# Patient Record
Sex: Female | Born: 1980 | Race: Black or African American | Hispanic: No | Marital: Single | Smoking: Current every day smoker
Health system: Southern US, Community
[De-identification: ages and names within clinical notes are randomized; demographics above are authoritative.]

## PROBLEM LIST (undated history)

## (undated) HISTORY — PX: TUBAL LIGATION: SHX77

---

## 2013-02-01 ENCOUNTER — Other Ambulatory Visit: Payer: Self-pay | Admitting: General Practice

## 2014-07-30 ENCOUNTER — Emergency Department (HOSPITAL_COMMUNITY)
Admission: EM | Admit: 2014-07-30 | Discharge: 2014-07-30 | Disposition: A | Discharge status: Home / Self Care | Payer: Medicaid Other | Attending: Emergency Medicine | Admitting: Emergency Medicine

## 2014-07-30 ENCOUNTER — Encounter (HOSPITAL_COMMUNITY): Payer: Self-pay | Admitting: *Deleted

## 2014-07-30 DIAGNOSIS — K088 Other specified disorders of teeth and supporting structures: Secondary | ICD-10-CM | POA: Diagnosis not present

## 2014-07-30 DIAGNOSIS — Z720 Tobacco use: Secondary | ICD-10-CM | POA: Insufficient documentation

## 2014-07-30 DIAGNOSIS — M542 Cervicalgia: Secondary | ICD-10-CM | POA: Diagnosis not present

## 2014-07-30 DIAGNOSIS — K029 Dental caries, unspecified: Secondary | ICD-10-CM | POA: Diagnosis not present

## 2014-07-30 DIAGNOSIS — K0889 Other specified disorders of teeth and supporting structures: Secondary | ICD-10-CM

## 2014-07-30 MED ORDER — IBUPROFEN 600 MG PO TABS: 600.0000 mg | ORAL_TABLET | Freq: Four times a day (QID) | ORAL | Status: AC | PRN

## 2014-07-30 MED ORDER — TRAMADOL HCL 50 MG PO TABS: ORAL_TABLET | ORAL | Status: AC

## 2014-07-30 MED ORDER — CLINDAMYCIN HCL 150 MG PO CAPS: ORAL_CAPSULE | ORAL | Status: AC

## 2014-07-30 NOTE — ED Notes (Signed)
Hobson Bryant at bedside 

## 2014-07-30 NOTE — ED Provider Notes (Signed)
CSN: [id number]     Arrival date & time 07/30/14  1637 History   None    Chief Complaint  Patient presents with  . Dental Pain     (Consider location/radiation/quality/duration/timing/severity/associated sxs/prior Treatment) Patient is a 34 y.o. female presenting with tooth pain. The history is provided by the patient.  Dental Pain Location:  Lower Quality:  Throbbing Severity:  Moderate Onset quality:  Gradual Duration:  2 months Timing:  Intermittent Progression:  Worsening Chronicity:  Chronic Context: dental caries   Relieved by:  Nothing Worsened by:  Cold food/drink Associated symptoms: gum swelling and neck pain   Associated symptoms: no drooling, no fever and no trismus   Risk factors: lack of dental care and smoking   Risk factors: no diabetes     History reviewed. No pertinent past medical history. Past Surgical History  Procedure Laterality Date  . Tubal ligation     No family history on file. History  Substance Use Topics  . Smoking status: Current Every Day Smoker -- 0.50 packs/day    Types: Cigarettes  . Smokeless tobacco: Not on file  . Alcohol Use: Yes     Comment: socially   OB History    No data available     Review of Systems  Constitutional: Negative for fever.  HENT: Positive for dental problem. Negative for drooling.   Musculoskeletal: Positive for neck pain.  All other systems reviewed and are negative.     Allergies  Review of patient's allergies indicates no known allergies.  Home Medications   Prior to Admission medications   Not on File   BP 138/97 mmHg  Pulse 80  Temp(Src) 99.1 F (37.3 C) (Oral)  Resp 16  Ht  (1.626 m)  Wt 171 lb (77.565 kg)  BMI 29.34 kg/m2  SpO2 100%  LMP 06/30/2014 Physical Exam  Constitutional: She is oriented to person, place, and time. She appears well-developed and well-nourished.  Non-toxic appearance.  HENT:  Head: Normocephalic.  Right Ear: Tympanic membrane and external ear  normal.  Left Ear: Tympanic membrane and external ear normal.  There is a deep cavity of the right lower third molar. There is swelling about the lower gum. No visible abscess appreciated. The airway is patent. There is no swelling under the tongue.  Eyes: EOM and lids are normal. Pupils are equal, round, and reactive to light.  Neck: Normal range of motion. Neck supple. Carotid bruit is not present.  Cardiovascular: Normal rate, regular rhythm, normal heart sounds, intact distal pulses and normal pulses.   No murmur heard. Pulmonary/Chest: Breath sounds normal. No respiratory distress.  Abdominal: Soft. Bowel sounds are normal. There is no tenderness. There is no guarding.  Musculoskeletal: Normal range of motion.  Lymphadenopathy:       Head (right side): No submandibular adenopathy present.       Head (left side): No submandibular adenopathy present.    She has no cervical adenopathy.  Neurological: She is alert and oriented to person, place, and time. She has normal strength. No cranial nerve deficit or sensory deficit.  Skin: Skin is warm and dry.  Psychiatric: She has a normal mood and affect. Her speech is normal.  Nursing note and vitals reviewed.   ED Course  Procedures (including critical care time) Labs Review Labs Reviewed - No data to display  Imaging Review No results found.   EKG Interpretation None      MDM  Vital signs reviewed.  Patient has a cavity  of a right lower molar. No evidence of abscess. No evidence for Ludwig's angina. Dental resource guide given to the patient. She will attempt to get a dental visit soon. Prescription for Cleocin 300 mg 3 times daily and ibuprofen and Ultram given to the patient.    Final diagnoses:  None    **I have reviewed nursing notes, vital signs, and all appropriate lab and imaging results for this patient.Ivery Quale*    Ashten Sarnowski, PA-C 07/30/14 1803  Donnetta HutchingBrian Cook, MD 07/30/14 [phone]

## 2014-07-30 NOTE — Discharge Instructions (Signed)
Dental Pain  Toothache is pain in or around a tooth. It may get worse with chewing or with cold or heat.   HOME CARE  · Your dentist may use a numbing medicine during treatment. If so, you may need to avoid eating until the medicine wears off. Ask your dentist about this.  · Only take medicine as told by your dentist or doctor.  · Avoid chewing food near the painful tooth until after all treatment is done. Ask your dentist about this.  GET HELP RIGHT AWAY IF:   · The problem gets worse or new problems appear.  · You have a fever.  · There is redness and puffiness (swelling) of the face, jaw, or neck.  · You cannot open your mouth.  · There is pain in the jaw.  · There is very bad pain that is not helped by medicine.  MAKE SURE YOU:   · Understand these instructions.  · Will watch your condition.  · Will get help right away if you are not doing well or get worse.  Document Released: 06/26/2007 Document Revised: 04/01/2011 Document Reviewed: 06/26/2007  Exit[employer]® Patient Information ©2015 [employer]. This information is not intended to replace advice given to you by your health care provider. Make sure you discuss any questions you have with your health care provider.

## 2014-07-30 NOTE — ED Notes (Signed)
Bottom R molar has been giving her problems for months. It has broken off at gum line and is hurting into neck.  States she recently got medicaid and will try get dental appointment.  Has been using Goody powder for pain.

## 2015-03-23 ENCOUNTER — Encounter (HOSPITAL_COMMUNITY): Payer: Self-pay | Admitting: *Deleted

## 2015-03-23 ENCOUNTER — Emergency Department (HOSPITAL_COMMUNITY): Payer: Self-pay

## 2015-03-23 ENCOUNTER — Emergency Department (HOSPITAL_COMMUNITY)
Admission: EM | Admit: 2015-03-23 | Discharge: 2015-03-24 | Disposition: A | Discharge status: Home / Self Care | Payer: Self-pay | Attending: Emergency Medicine | Admitting: Emergency Medicine

## 2015-03-23 DIAGNOSIS — K5900 Constipation, unspecified: Secondary | ICD-10-CM | POA: Insufficient documentation

## 2015-03-23 DIAGNOSIS — K6289 Other specified diseases of anus and rectum: Secondary | ICD-10-CM | POA: Insufficient documentation

## 2015-03-23 DIAGNOSIS — F17210 Nicotine dependence, cigarettes, uncomplicated: Secondary | ICD-10-CM | POA: Insufficient documentation

## 2015-03-23 DIAGNOSIS — Z3202 Encounter for pregnancy test, result negative: Secondary | ICD-10-CM | POA: Insufficient documentation

## 2015-03-23 LAB — POC URINE PREG, ED: PREG TEST UR: NEGATIVE

## 2015-03-23 NOTE — ED Provider Notes (Signed)
CSN: [id number]     Arrival date & time 03/23/15  2151 History  By signing my name below, I, Marisue Humble, attest that this documentation has been prepared under the direction and in the presence of non-physician practitioner, Burgess Amor, PA-C. Electronically Signed: Marisue Humble, Scribe. 03/23/2015. 10:52 PM.   Chief Complaint  Patient presents with  . Constipation   The history is provided by the patient. No language interpreter was used.   HPI Comments:  Joyce Henderson is a 35 y.o. female with no pertinent PMHx who presents to the Emergency Department complaining of persistent constipation for the past two days. She notes she can feel pressure and reports associated pain in her rectum. She reports some diarrhea due to stool softeners, Dulcolax, and Miralax. Pt reports she has a h/o hemorrhoids but currently symptoms feel different.  She denies history of significant constipation.  She has had hemorrhoids in the past, first when pregnant with her second child, but current symptoms are different with no palpable mass. Denies trauma.  She denies fevers, chills, nausea.   Pt has no other symptoms or complaints at this time.  History reviewed. No pertinent past medical history. Past Surgical History  Procedure Laterality Date  . Tubal ligation     History reviewed. No pertinent family history. Social History  Substance Use Topics  . Smoking status: Current Every Day Smoker -- 0.50 packs/day    Types: Cigarettes  . Smokeless tobacco: None  . Alcohol Use: Yes     Comment: socially   OB History    No data available     Review of Systems  Gastrointestinal: Positive for constipation and rectal pain.  All other systems reviewed and are negative.  Allergies  Review of patient's allergies indicates no known allergies.  Home Medications   Prior to Admission medications   Medication Sig Start Date End Date Taking? Authorizing Provider  clindamycin (CLEOCIN) 150 MG capsule 2 po tid  with food 07/30/14   Ivery Quale, PA-C  hydrocortisone (ANUSOL-HC) 25 MG suppository Place 1 suppository (25 mg total) rectally 2 (two) times daily. 03/24/15   Burgess Amor, PA-C  ibuprofen (ADVIL,MOTRIN) 600 MG tablet Take 1 tablet (600 mg total) by mouth every 6 (six) hours as needed. 07/30/14   Ivery Quale, PA-C  metroNIDAZOLE (FLAGYL) 500 MG tablet Take 1 tablet (500 mg total) by mouth 2 (two) times daily. 03/24/15   Burgess Amor, PA-C  traMADol (ULTRAM) 50 MG tablet 1 or 2 po q6h prn pain 07/30/14   Ivery Quale, PA-C   BP 117/70 mmHg  Pulse 81  Temp(Src) 98.6 F (37 C) (Oral)  Resp 16  Ht  (1.626 m)  Wt 77.111 kg  BMI 29.17 kg/m2  SpO2 100%  LMP 02/24/2015 Physical Exam  Constitutional: She appears well-developed and well-nourished.  HENT:  Head: Normocephalic and atraumatic.  Eyes: Conjunctivae are normal.  Neck: Normal range of motion.  Cardiovascular: Normal rate, regular rhythm, normal heart sounds and intact distal pulses.   Pulmonary/Chest: Effort normal and breath sounds normal. She has no wheezes.  Abdominal: Soft. Bowel sounds are normal. She exhibits no distension and no mass. There is no tenderness. There is no rebound and no guarding.  Genitourinary: Rectal exam shows tenderness. Rectal exam shows no external hemorrhoid, no fissure and no mass.  Anus appears inflamed, ttp along course of anal canal, tender.  No stool impaction.  Musculoskeletal: Normal range of motion.  Neurological: She is alert.  Skin: Skin is warm and  dry.  Psychiatric: She has a normal mood and affect.  Nursing note and vitals reviewed.   ED Course  Procedures  DIAGNOSTIC STUDIES:  Oxygen Saturation is 100% on RA, normal by my interpretation.    COORDINATION OF CARE:  10:45 PM Will conduct rectal exam. Discussed treatment plan with pt at bedside and pt agreed to plan.  Labs Review Labs Reviewed  POC URINE PREG, ED    Imaging Review Dg Abd 2 Views  03/24/2015  CLINICAL DATA:   35 year old female with constipation and nausea. Rectal pain. EXAM: ABDOMEN - 2 VIEW COMPARISON:  None. FINDINGS: There is no evidence of bowel obstruction. Air is noted throughout the colon. Mild amount of stool burden. No radiopaque calculi. No free air. Bilateral tubal occlusive device is noted. The osseous structures appear intact. IMPRESSION: Mild stool burden.  No bowel obstruction. Electronically Signed   By: Elgie Collard M.D.   On: 03/24/2015 00:55   I have personally reviewed and evaluated these images and lab results as part of my medical decision-making.   EKG Interpretation None      MDM   Final diagnoses:  Proctitis    Pt with exam c/w proctitis.   Anusol, flagyl prescribed.  Pt advised to continue taking miralax to keep stool soft.  F/u with pcp this week if sx persist or worsen.  I personally performed the services described in this documentation, which was scribed in my presence. The recorded information has been reviewed and is accurate.   Burgess Amor, PA-C 03/24/15 Josefa Half  Devoria Albe, MD 03/24/15 [phone]

## 2015-03-23 NOTE — ED Notes (Signed)
Pt reporting constipation for past 2 days.  Reports no relief from laxatives or stool softeners.

## 2015-03-24 MED ORDER — METRONIDAZOLE 500 MG PO TABS
500.0000 mg | ORAL_TABLET | Freq: Once | ORAL | Status: AC
  Administered 2015-03-24: 500 mg via ORAL
  Filled 2015-03-24: qty 1

## 2015-03-24 MED ORDER — HYDROCORTISONE ACETATE 25 MG RE SUPP: 25.0000 mg | Freq: Two times a day (BID) | RECTAL | Status: AC

## 2015-03-24 MED ORDER — METRONIDAZOLE 500 MG PO TABS: 500.0000 mg | ORAL_TABLET | Freq: Two times a day (BID) | ORAL | Status: AC

## 2015-03-24 NOTE — Discharge Instructions (Signed)
Proctitis Proctitis is swelling and soreness (inflammation) of the lining of the rectum. The rectum is at the end of the large intestine, and it leads to the anus. The inflammation causes pain and discomfort. It may be a short-term (acute) or long-lasting (chronic) problem. CAUSES This condition may be caused by:  STDs (sexually transmitted diseases).  Infection.  Trauma or injury to the anus or rectum.  Ulcerative colitis or Crohn disease.  Radiation therapy that is directed near the rectum.  Antibiotic therapy. SYMPTOMS Symptoms of this condition include:  Sudden, uncomfortable, and frequent urge to have a bowel movement.  Anal pain or rectal pain.  Pain or cramping in the abdomen.  Sensation that the rectum is full.  Rectal bleeding.  Pus or mucus discharge from the anus.  Diarrhea or frequent soft, loose stools.  Constipation.  Pain with bowel movements. DIAGNOSIS This condition may be diagnosed based on:  A medical history and physical exam.  Various tests, such as:  An STD test.  Blood tests.  Stool tests.  Rectal culture.  A procedure to evaluate the anal canal (anoscopy).  Procedures to look at the entire large bowel or part of it (colonoscopy or sigmoidoscopy). TREATMENT Treatment for this condition depends on the cause. The main goals of treatment are to reduce the symptoms of inflammation and to get rid of any infection. Treatment may include:  Home remedies and lifestyle changes, such as sitz baths and avoiding food right before bedtime.  Medicines, such as:  Topical ointments, foams, suppositories, or enemas, such as corticosteroids or anti-inflammatories.  Antibiotic or antiviral medicines to treat infection or to control harmful bacteria.  Medicines to control diarrhea, soften stools, and reduce pain.  Medicines to suppress the immune system.  Nutritional, dietary, or herbal supplements.  Avoiding the activity that caused rectal  trauma.  Heat or laser therapy for persistent bleeding.  A dilation procedure to enlarge a narrowed rectum.  Surgery to repair damaged rectal lining. This is rare. If your proctitis was caused by an STD, your health care provider may test you for infection again 3 months after treatment. HOME CARE INSTRUCTIONS  Take over-the-counter and prescription medicines only as told by your health care provider.  If you were prescribed an antibiotic medicine, take it as told by your health care provider. Do not stop taking the antibiotic even if you start to feel better.  Try to avoid eating right before bedtime.  Take sitz baths as told by your health care provider.  Keep all follow-up visits as told by your health care provider. This is important. SEEK MEDICAL CARE IF:  Your symptoms do not improve with treatment.  Your symptoms get worse.  You have a fever.   This information is not intended to replace advice given to you by your health care provider. Make sure you discuss any questions you have with your health care provider.   Document Released: 12/27/2010 Document Revised: 09/28/2014 Document Reviewed: 04/04/2014 Elsevier Interactive Patient Education [employer]2016 [employer]   Take the entire course of antibiotics and apply the anusol suppository medication twice daily.  Warm tub soaks may be soothing.  Continue taking your miralax to keep your stools soft.

## 2016-12-28 IMAGING — DX DG ABDOMEN 2V
3 series · 3 of 3 positions shown · non-contrast
Comparison: None.

CLINICAL DATA: 34-year-old female with constipation and nausea.
Rectal pain.

EXAM:
ABDOMEN - 2 VIEW

[abdomen erect]
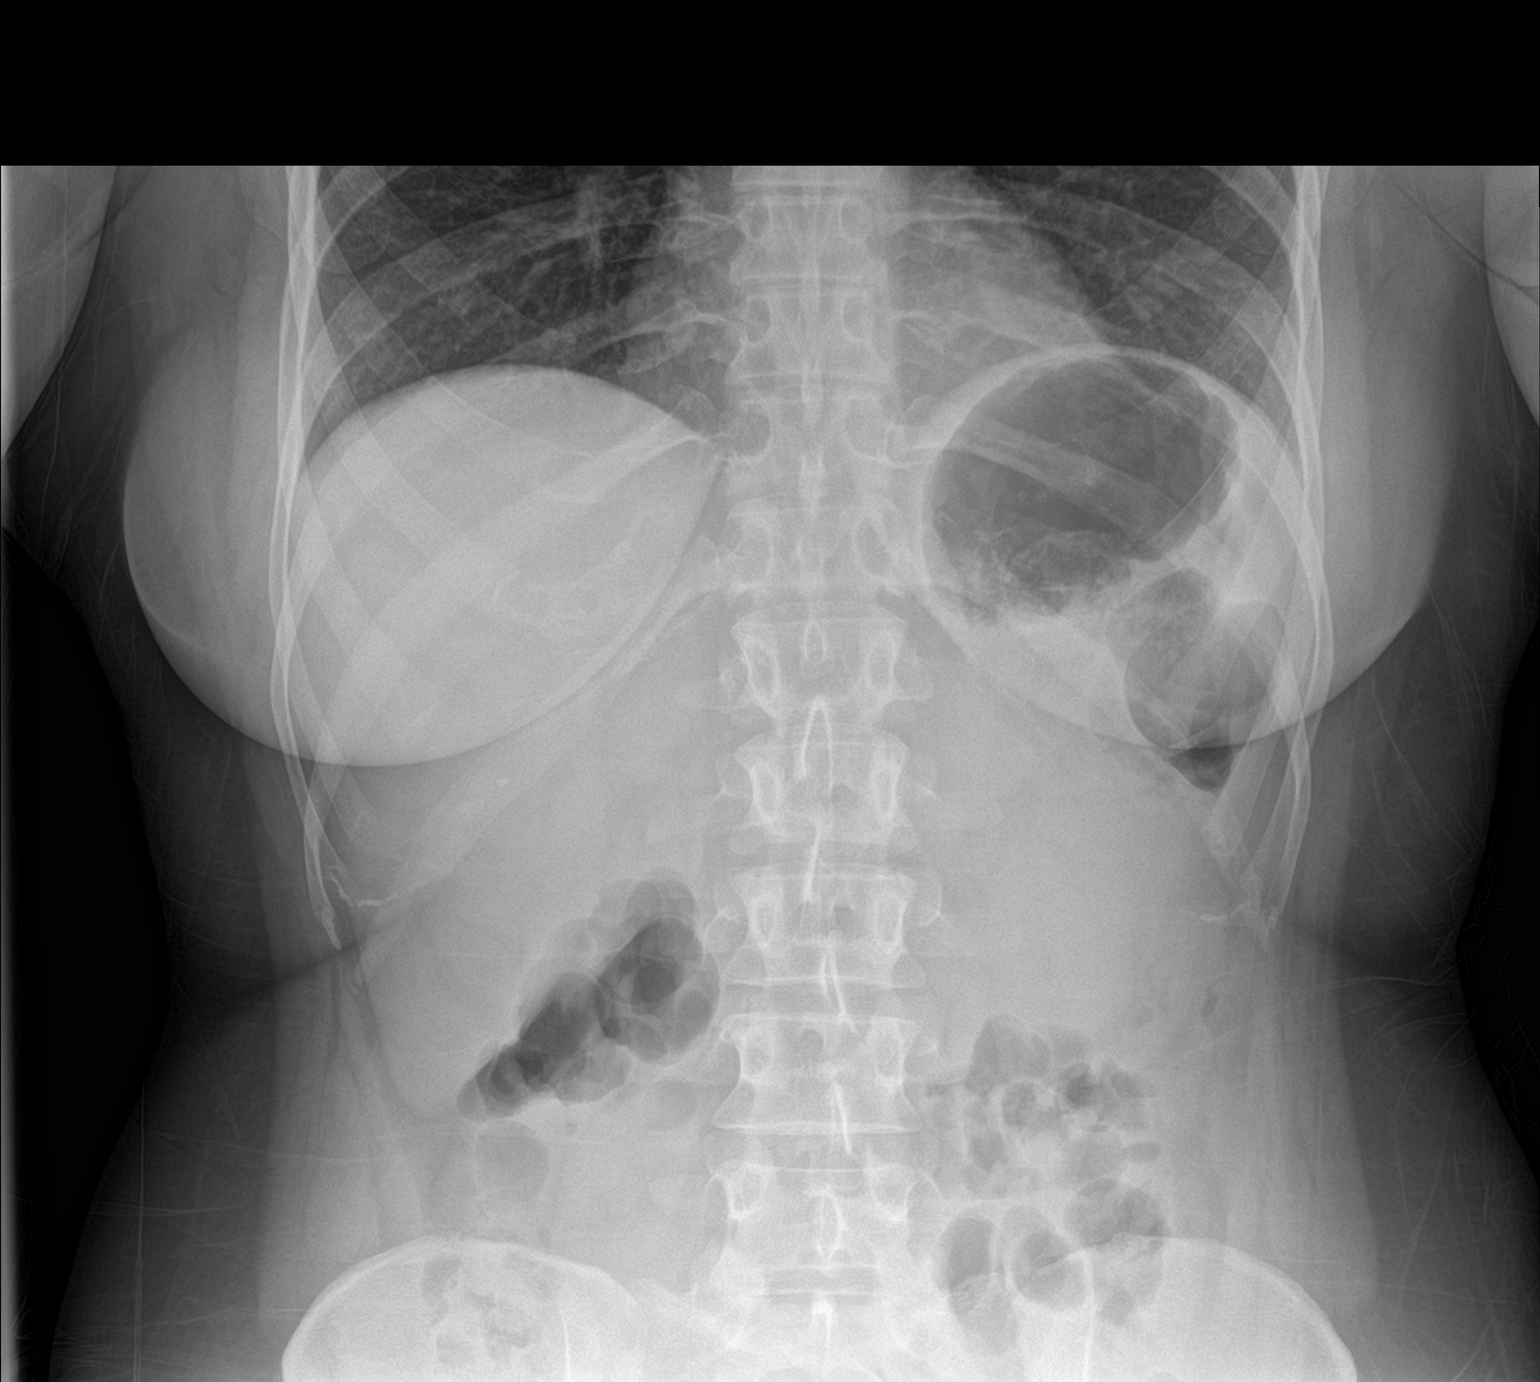

[abdomen supine (1 of 2)]
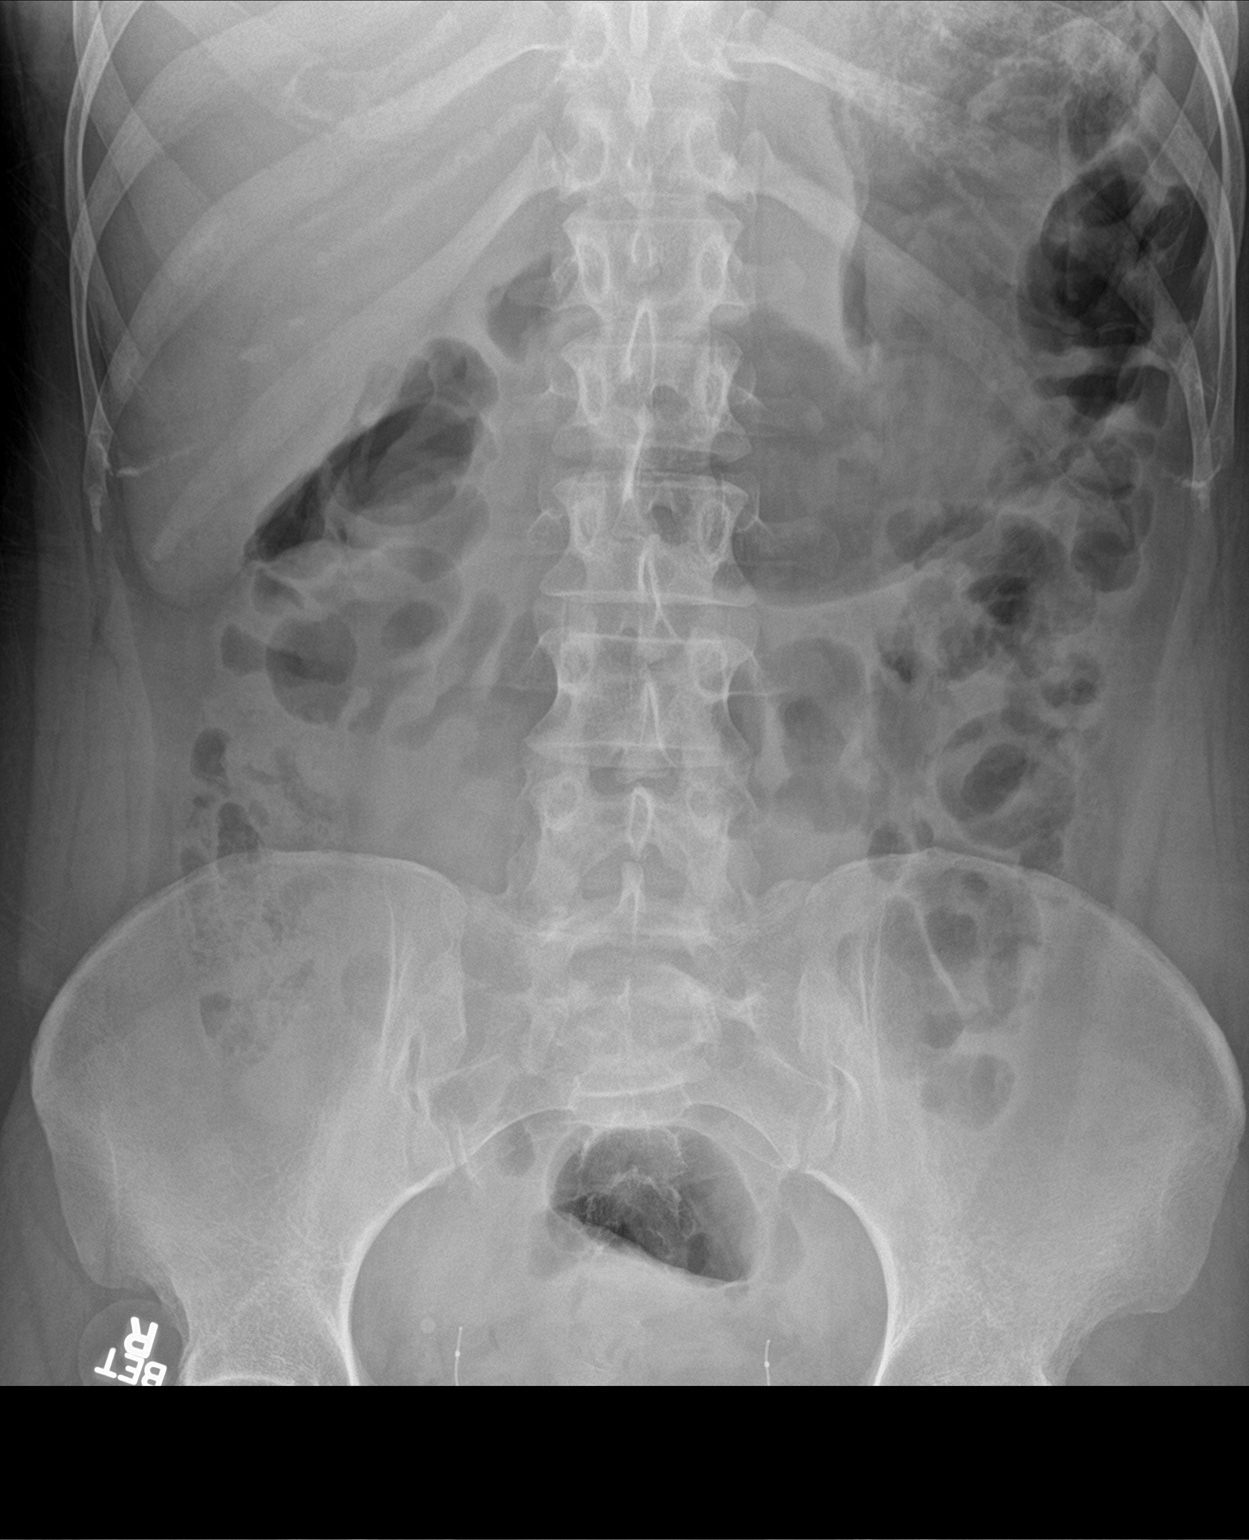

[abdomen supine (2 of 2)]
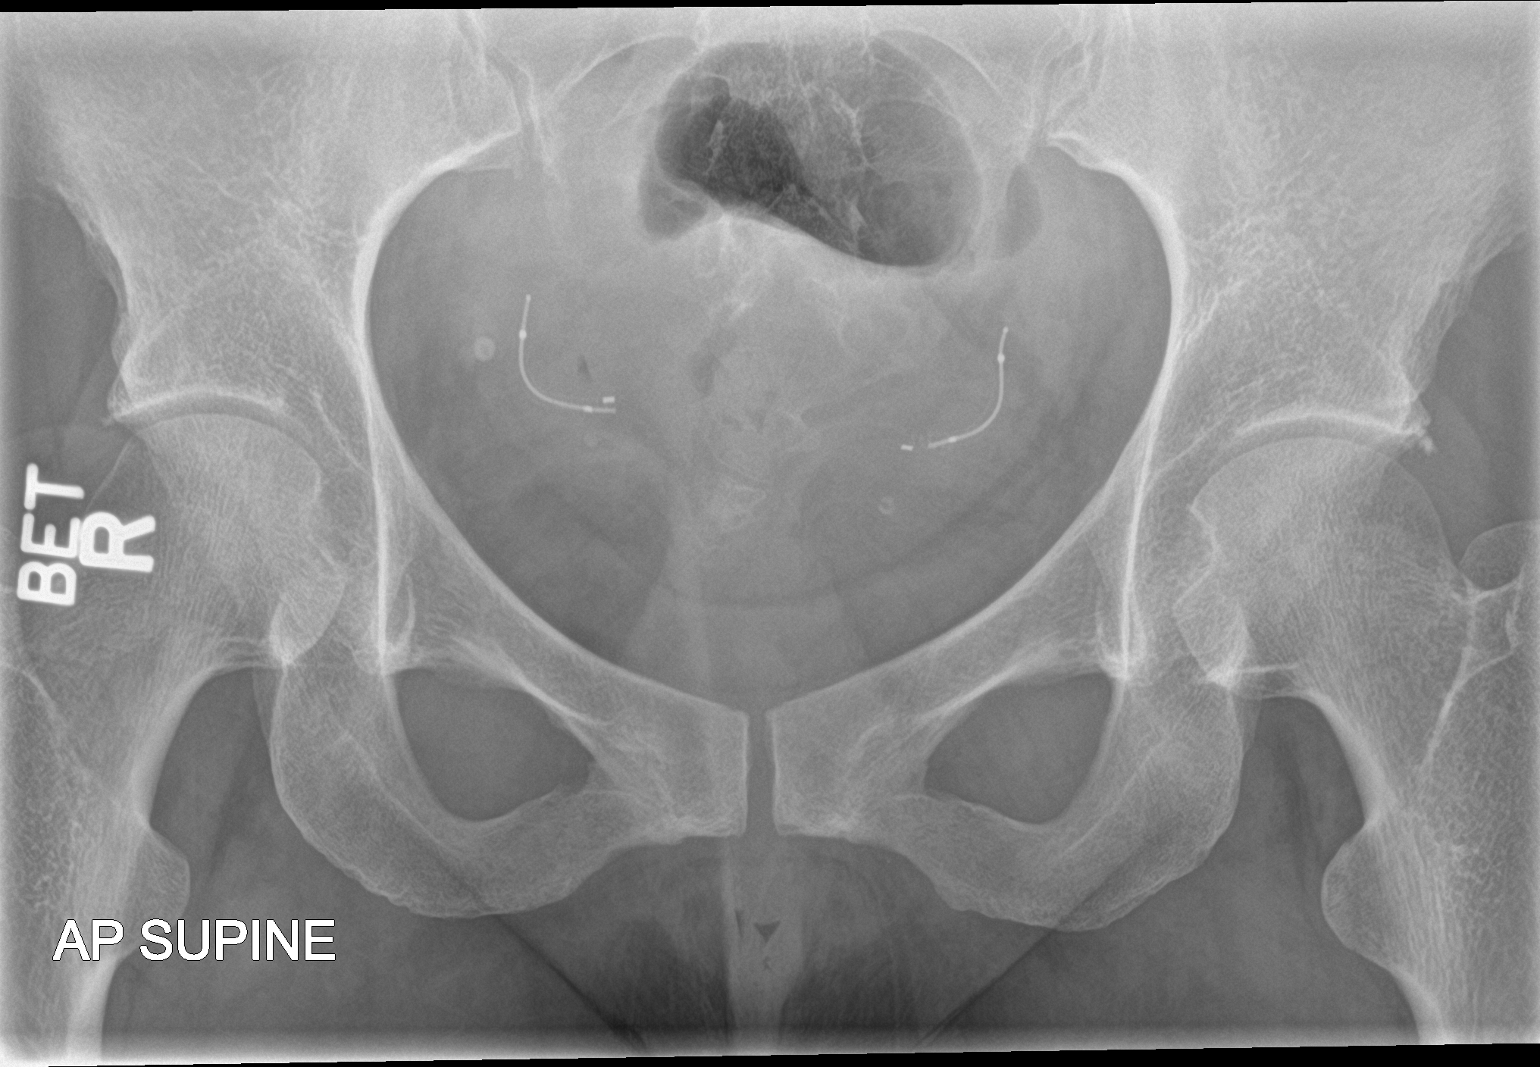

[3 of 3 positions shown; findings below may reference images not displayed]

FINDINGS: There is no evidence of bowel obstruction. Air is noted throughout
the colon. Mild amount of stool burden. No radiopaque calculi. No
free air. Bilateral tubal occlusive device is noted. The osseous
structures appear intact.
IMPRESSION: Mild stool burden.  No bowel obstruction.

## 2018-08-30 DIAGNOSIS — H5213 Myopia, bilateral: Secondary | ICD-10-CM | POA: Diagnosis not present

## 2018-09-07 DIAGNOSIS — Z20828 Contact with and (suspected) exposure to other viral communicable diseases: Secondary | ICD-10-CM | POA: Diagnosis not present

## 2018-09-13 DIAGNOSIS — Z20828 Contact with and (suspected) exposure to other viral communicable diseases: Secondary | ICD-10-CM | POA: Diagnosis not present

## 2018-09-21 DIAGNOSIS — U071 COVID-19: Secondary | ICD-10-CM | POA: Diagnosis not present

## 2018-12-09 DIAGNOSIS — Z20828 Contact with and (suspected) exposure to other viral communicable diseases: Secondary | ICD-10-CM | POA: Diagnosis not present
